# Patient Record
Sex: Female | Born: 1970
Health system: Southern US, Community
[De-identification: ages and names within clinical notes are randomized; demographics above are authoritative.]

## PROBLEM LIST (undated history)

## (undated) DIAGNOSIS — F32A Depression, unspecified: Secondary | ICD-10-CM

## (undated) DIAGNOSIS — Z8489 Family history of other specified conditions: Secondary | ICD-10-CM

## (undated) DIAGNOSIS — D649 Anemia, unspecified: Secondary | ICD-10-CM

## (undated) DIAGNOSIS — F419 Anxiety disorder, unspecified: Secondary | ICD-10-CM

## (undated) HISTORY — PX: LAPAROSCOPIC GELPORT ASSISTED MYOMECTOMY: SHX6549

## (undated) HISTORY — DX: Anxiety disorder, unspecified: F41.9

## (undated) HISTORY — DX: Depression, unspecified: F32.A

## (undated) HISTORY — DX: Anemia, unspecified: D64.9

## (undated) HISTORY — PX: UTERINE FIBROID SURGERY: SHX826

---
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
## 1999-09-10 ENCOUNTER — Emergency Department (HOSPITAL_COMMUNITY): Admission: EM | Admit: 1999-09-10 | Discharge: 1999-09-10 | Payer: Self-pay | Admitting: *Deleted

## 2003-06-12 ENCOUNTER — Other Ambulatory Visit: Admission: RE | Admit: 2003-06-12 | Discharge: 2003-06-12 | Payer: Self-pay | Admitting: Obstetrics and Gynecology

## 2004-06-20 ENCOUNTER — Other Ambulatory Visit: Admission: RE | Admit: 2004-06-20 | Discharge: 2004-06-20 | Payer: Self-pay | Admitting: Obstetrics and Gynecology

## 2004-12-04 ENCOUNTER — Other Ambulatory Visit: Admission: RE | Admit: 2004-12-04 | Discharge: 2004-12-04 | Payer: Self-pay | Admitting: Obstetrics and Gynecology

## 2005-04-16 ENCOUNTER — Other Ambulatory Visit: Admission: RE | Admit: 2005-04-16 | Discharge: 2005-04-16 | Payer: Self-pay | Admitting: Obstetrics and Gynecology

## 2005-04-17 ENCOUNTER — Other Ambulatory Visit: Admission: RE | Admit: 2005-04-17 | Discharge: 2005-04-17 | Payer: Self-pay | Admitting: Obstetrics and Gynecology

## 2005-09-18 ENCOUNTER — Other Ambulatory Visit: Admission: RE | Admit: 2005-09-18 | Discharge: 2005-09-18 | Payer: Self-pay | Admitting: Obstetrics and Gynecology

## 2006-12-21 ENCOUNTER — Ambulatory Visit (HOSPITAL_COMMUNITY): Admission: RE | Admit: 2006-12-21 | Discharge: 2006-12-21 | Payer: Self-pay | Admitting: Obstetrics and Gynecology

## 2007-07-05 IMAGING — RF DG HYSTEROGRAM
4 series · 4 of 4 positions shown · IV contrast (omnipaque)
Comparison: none

CLINICAL DATA: Evaluate tube patentcy due to large right-sided fibroid; infertility.  
HYSTEROSALPINGOGRAM:
TECHNIQUE: Following cleansing of the cervix and vagina with Betadine, a hysterosalpingogram catheter was placed into the endocervical canal.  Hysterosalpingogram was then performed using Omnipaque 300 contrast.

[Series 1: run · 1 of 1 slices shown (1 of 4)]
[im 1/1]
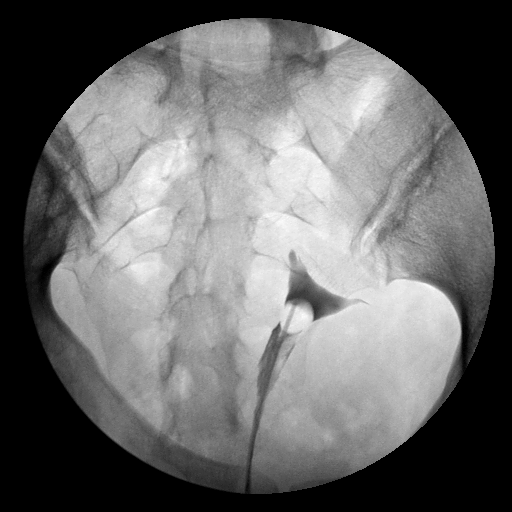

[Series 2: run · 1 of 1 slices shown (2 of 4)]
[im 1/1]
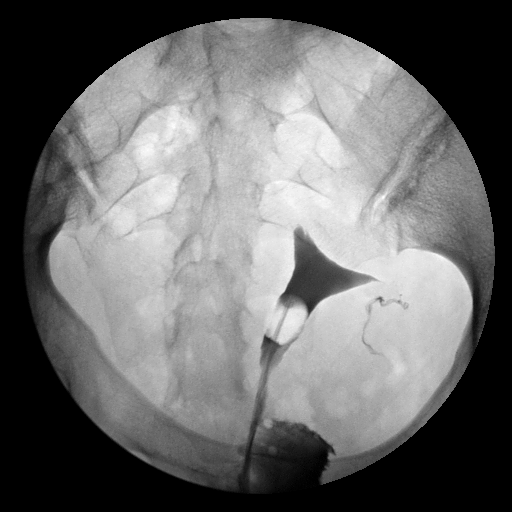

[Series 3: run · 1 of 1 slices shown (3 of 4)]
[im 1/1]
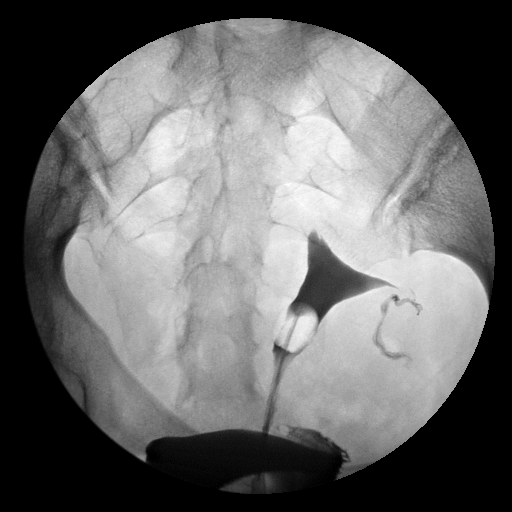

[Series 4: run · 1 of 1 slices shown (4 of 4)]
[im 1/1]
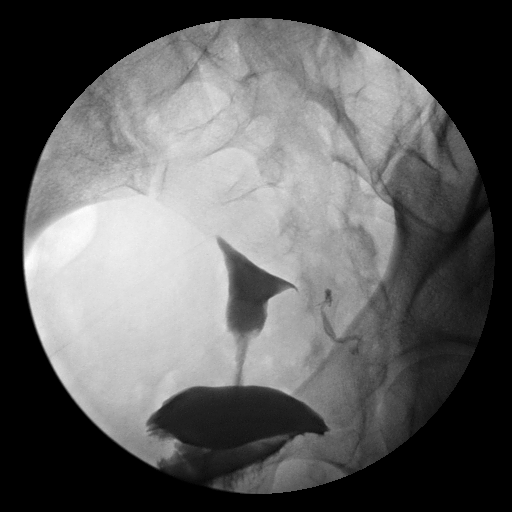

[4 of 4 positions shown; findings below may reference images not displayed]

FINDINGS: The uterus has a normal appearance with no evidence of filling defect or extrinsic mass effect.  The right fallopian tube does not fill and is blocked proximally.  The left fallopian tube fills to its fimbrial portion, but there is no free spill of contrast into the adnexa.
IMPRESSION: 1.  The right tube is blocked proximally.  I do not see extrinsic mass effect from the known fibroid on the right.  
2.  The left tube fills to its fimbrial portion, but is blocked distally as there is no free spill of contrast into the left adnexal region.

## 2007-10-20 HISTORY — PX: UTERINE FIBROID SURGERY: SHX826

## 2009-01-27 ENCOUNTER — Inpatient Hospital Stay (HOSPITAL_COMMUNITY): Admission: AD | Admit: 2009-01-27 | Discharge: 2009-01-30 | Payer: Self-pay | Admitting: Obstetrics and Gynecology

## 2009-02-10 ENCOUNTER — Inpatient Hospital Stay (HOSPITAL_COMMUNITY): Admission: AD | Admit: 2009-02-10 | Discharge: 2009-02-10 | Payer: Self-pay | Admitting: Obstetrics & Gynecology

## 2011-01-28 LAB — GLUCOSE, CAPILLARY: Glucose-Capillary: 76 mg/dL (ref 70–99)

## 2011-01-28 LAB — CBC
HCT: 31.5 % — ABNORMAL LOW (ref 36.0–46.0)
HCT: 37.5 % (ref 36.0–46.0)
Hemoglobin: 11.2 g/dL — ABNORMAL LOW (ref 12.0–15.0)
Hemoglobin: 12.8 g/dL (ref 12.0–15.0)
MCV: 95.5 fL (ref 78.0–100.0)
MCV: 95.8 fL (ref 78.0–100.0)
Platelets: 174 10*3/uL (ref 150–400)
Platelets: 216 10*3/uL (ref 150–400)
RDW: 13.3 % (ref 11.5–15.5)
WBC: 11.2 10*3/uL — ABNORMAL HIGH (ref 4.0–10.5)
WBC: 19.6 10*3/uL — ABNORMAL HIGH (ref 4.0–10.5)

## 2011-01-28 LAB — RPR: RPR Ser Ql: NONREACTIVE

## 2011-08-07 ENCOUNTER — Other Ambulatory Visit (HOSPITAL_COMMUNITY): Payer: Self-pay | Admitting: Obstetrics and Gynecology

## 2011-08-07 DIAGNOSIS — Z1231 Encounter for screening mammogram for malignant neoplasm of breast: Secondary | ICD-10-CM

## 2011-08-27 ENCOUNTER — Ambulatory Visit (HOSPITAL_COMMUNITY)
Admission: RE | Admit: 2011-08-27 | Discharge: 2011-08-27 | Disposition: A | Discharge status: Home / Self Care | Payer: BC Managed Care – PPO | Source: Ambulatory Visit | Attending: Obstetrics and Gynecology | Admitting: Obstetrics and Gynecology

## 2011-08-27 DIAGNOSIS — Z1231 Encounter for screening mammogram for malignant neoplasm of breast: Secondary | ICD-10-CM

## 2012-12-12 ENCOUNTER — Other Ambulatory Visit (HOSPITAL_COMMUNITY): Payer: Self-pay | Admitting: Obstetrics and Gynecology

## 2012-12-12 DIAGNOSIS — Z1231 Encounter for screening mammogram for malignant neoplasm of breast: Secondary | ICD-10-CM

## 2012-12-19 ENCOUNTER — Ambulatory Visit (HOSPITAL_COMMUNITY)
Admission: RE | Admit: 2012-12-19 | Discharge: 2012-12-19 | Disposition: A | Discharge status: Home / Self Care | Payer: BC Managed Care – PPO | Source: Ambulatory Visit | Attending: Obstetrics and Gynecology | Admitting: Obstetrics and Gynecology

## 2012-12-19 DIAGNOSIS — Z1231 Encounter for screening mammogram for malignant neoplasm of breast: Secondary | ICD-10-CM | POA: Insufficient documentation

## 2013-12-27 ENCOUNTER — Other Ambulatory Visit (HOSPITAL_COMMUNITY): Payer: Self-pay | Admitting: Obstetrics and Gynecology

## 2013-12-27 DIAGNOSIS — Z1231 Encounter for screening mammogram for malignant neoplasm of breast: Secondary | ICD-10-CM

## 2014-01-03 ENCOUNTER — Ambulatory Visit (HOSPITAL_COMMUNITY)
Admission: RE | Admit: 2014-01-03 | Discharge: 2014-01-03 | Disposition: A | Discharge status: Home / Self Care | Payer: BC Managed Care – PPO | Source: Ambulatory Visit | Attending: Obstetrics and Gynecology | Admitting: Obstetrics and Gynecology

## 2014-01-03 ENCOUNTER — Other Ambulatory Visit (HOSPITAL_COMMUNITY): Payer: Self-pay | Admitting: Obstetrics and Gynecology

## 2014-01-03 DIAGNOSIS — Z1231 Encounter for screening mammogram for malignant neoplasm of breast: Secondary | ICD-10-CM

## 2014-05-11 ENCOUNTER — Ambulatory Visit (INDEPENDENT_AMBULATORY_CARE_PROVIDER_SITE_OTHER): Payer: BC Managed Care – PPO | Admitting: Family Medicine

## 2014-05-11 VITALS — BP 110/58 | HR 73 | Temp 98.1°F | Resp 16 | Ht 66.0 in | Wt 154.6 lb

## 2014-05-11 DIAGNOSIS — R059 Cough, unspecified: Secondary | ICD-10-CM

## 2014-05-11 DIAGNOSIS — R05 Cough: Secondary | ICD-10-CM

## 2014-05-11 MED ORDER — BENZONATATE 100 MG PO CAPS: 100.0000 mg | ORAL_CAPSULE | Freq: Three times a day (TID) | ORAL | Status: DC | PRN

## 2014-05-11 MED ORDER — AZITHROMYCIN 250 MG PO TABS: ORAL_TABLET | ORAL | Status: DC

## 2014-05-11 MED ORDER — HYDROCODONE-HOMATROPINE 5-1.5 MG/5ML PO SYRP: 5.0000 mL | ORAL_SOLUTION | Freq: Three times a day (TID) | ORAL | Status: DC | PRN

## 2014-05-11 NOTE — Patient Instructions (Addendum)
Use the azithromycin as directed, and the tessalon perles for cough.  Also, use the hycodan as needed for cough at night- do not use when you need to drive.    Let me know if you are not better in the next few days- Sooner if worse.

## 2014-05-11 NOTE — Progress Notes (Signed)
[facility] [address] [place] [address] [phone]  Date:  05/11/2014   Name:  Emily Nguyen   DOB:  1971/05/27   MRN:  [id number]  PCP:  No PCP Per Patient    Chief Complaint: Cough, Headache and Sore Throat   History of Present Illness:  Emily Nguyen is a 43 y.o. very pleasant female patient who presents with the following:  She is hee today with a cough/ cold "that will not go away."  She has been ill for about 10 days.  Notes that she is coughing quite a bit, and also had had some headache in her frontal sinuses.  The cough can be productive.  She is having some fits of coughing and will cough to the point of gagging some of the time. No post- tussive emesis  She did have a mild earache over the weekend, but this resolved.  Some clear nasal drainage but nothing persistent No GI symptoms.  She has not noted a fever, chills or aches.    She is generally in excellent health.  She rarely needs to see an MD- she only has her OBG and does not have another primary provider at this time.  She is married with a 26 year old son.  Never smoker LMP 7/16.    There are no active problems to display for this patient.   History reviewed. No pertinent past medical history.  Past Surgical History  Procedure Laterality Date  . Uterine fibroid surgery      History  Substance Use Topics  . Smoking status: Never Smoker   . Smokeless tobacco: Not on file  . Alcohol Use: Not on file    Family History  Problem Relation Age of Onset  . Cancer Father   . Cancer Maternal Grandmother   . Heart disease Paternal Grandmother   . Hypertension Paternal Grandmother     Allergies  Allergen Reactions  . Penicillins Hives    Medication list has been reviewed and updated.  No current outpatient prescriptions on file prior to visit.   No current facility-administered medications on file prior to visit.    Review of Systems:  As per HPI- otherwise  negative.   Physical Examination: Filed Vitals:   05/11/14 1428  BP: 110/58  Pulse: 73  Temp: 98.1 F (36.7 C)  Resp: 16   Filed Vitals:   05/11/14 1428  Height: 5\' 6"  (1.676 m)  Weight: 154 lb 9.6 oz (70.126 kg)   Body mass index is 24.96 kg/(m^2). Ideal Body Weight: Weight in (lb) to have BMI = 25: 154.6  GEN: WDWN, NAD, Non-toxic, A & O x 3, looks well.   HEENT: Atraumatic, Normocephalic. Neck supple. No masses, No LAD. Bilateral TM wnl, oropharynx normal.  PEERL,EOMI.   Nasal cavity WNL Ears and Nose: No external deformity. CV: RRR, No M/G/R. No JVD. No thrill. No extra heart sounds. PULM: CTA B, no wheezes, crackles, rhonchi. No retractions. No resp. distress. No accessory muscle use. EXTR: No c/c/e NEURO Normal gait.  PSYCH: Normally interactive. Conversant. Not depressed or anxious appearing.  Calm demeanor.    Assessment and Plan: Cough - Plan: azithromycin (ZITHROMAX) 250 MG tablet, benzonatate (TESSALON) 100 MG capsule, HYDROcodone-homatropine (HYCODAN) 5-1.5 MG/5ML syrup  persistent cough for 10 days.  Consider mycoplasma or pertussis.  Izetta declines a pertussis test, but would like to be treated for same.  Azithromycin, hycodan and tessalon as above.  Close follow-up if not  better soon- Sooner if worse.     Signed Lamar Blinks, MD

## 2014-12-04 ENCOUNTER — Ambulatory Visit (INDEPENDENT_AMBULATORY_CARE_PROVIDER_SITE_OTHER): Payer: BLUE CROSS/BLUE SHIELD

## 2014-12-05 ENCOUNTER — Ambulatory Visit (INDEPENDENT_AMBULATORY_CARE_PROVIDER_SITE_OTHER): Payer: BLUE CROSS/BLUE SHIELD | Admitting: Physician Assistant

## 2014-12-05 VITALS — BP 98/64 | HR 61 | Temp 97.9°F | Resp 16 | Ht 65.25 in | Wt 154.0 lb

## 2014-12-05 DIAGNOSIS — R05 Cough: Secondary | ICD-10-CM | POA: Diagnosis not present

## 2014-12-05 DIAGNOSIS — J069 Acute upper respiratory infection, unspecified: Secondary | ICD-10-CM | POA: Diagnosis not present

## 2014-12-05 DIAGNOSIS — R059 Cough, unspecified: Secondary | ICD-10-CM

## 2014-12-05 MED ORDER — BENZONATATE 100 MG PO CAPS: 100.0000 mg | ORAL_CAPSULE | Freq: Three times a day (TID) | ORAL | Status: DC | PRN

## 2014-12-05 MED ORDER — GUAIFENESIN ER 1200 MG PO TB12: 1.0000 | ORAL_TABLET | Freq: Two times a day (BID) | ORAL | Status: DC | PRN

## 2014-12-05 NOTE — Progress Notes (Signed)
  Medical screening examination/treatment/procedure(s) were performed by non-physician practitioner and as supervising physician I was immediately available for consultation/collaboration.     

## 2014-12-05 NOTE — Patient Instructions (Signed)
Upper Respiratory Infection, Adult An upper respiratory infection (URI) is also sometimes known as the common cold. The upper respiratory tract includes the nose, sinuses, throat, trachea, and bronchi. Bronchi are the airways leading to the lungs. Most people improve within 1 week, but symptoms can last up to 2 weeks. A residual cough may last even longer.  CAUSES Many different viruses can infect the tissues lining the upper respiratory tract. The tissues become irritated and inflamed and often become very moist. Mucus production is also common. A cold is contagious. You can easily spread the virus to others by oral contact. This includes kissing, sharing a glass, coughing, or sneezing. Touching your mouth or nose and then touching a surface, which is then touched by another person, can also spread the virus. SYMPTOMS  Symptoms typically develop 1 to 3 days after you come in contact with a cold virus. Symptoms vary from person to person. They may include:  Runny nose.  Sneezing.  Nasal congestion.  Sinus irritation.  Sore throat.  Loss of voice (laryngitis).  Cough.  Fatigue.  Muscle aches.  Loss of appetite.  Headache.  Low-grade fever. DIAGNOSIS  You might diagnose your own cold based on familiar symptoms, since most people get a cold 2 to 3 times a year. Your caregiver can confirm this based on your exam. Most importantly, your caregiver can check that your symptoms are not due to another disease such as strep throat, sinusitis, pneumonia, asthma, or epiglottitis. Blood tests, throat tests, and X-rays are not necessary to diagnose a common cold, but they may sometimes be helpful in excluding other more serious diseases. Your caregiver will decide if any further tests are required. RISKS AND COMPLICATIONS  You may be at risk for a more severe case of the common cold if you smoke cigarettes, have chronic heart disease (such as heart failure) or lung disease (such as asthma), or if  you have a weakened immune system. The very young and very old are also at risk for more serious infections. Bacterial sinusitis, middle ear infections, and bacterial pneumonia can complicate the common cold. The common cold can worsen asthma and chronic obstructive pulmonary disease (COPD). Sometimes, these complications can require emergency medical care and may be life-threatening. PREVENTION  The best way to protect against getting a cold is to practice good hygiene. Avoid oral or hand contact with people with cold symptoms. Wash your hands often if contact occurs. There is no clear evidence that vitamin C, vitamin E, echinacea, or exercise reduces the chance of developing a cold. However, it is always recommended to get plenty of rest and practice good nutrition. TREATMENT  Treatment is directed at relieving symptoms. There is no cure. Antibiotics are not effective, because the infection is caused by a virus, not by bacteria. Treatment may include:  Increased fluid intake. Sports drinks offer valuable electrolytes, sugars, and fluids.  Breathing heated mist or steam (vaporizer or shower).  Eating chicken soup or other clear broths, and maintaining good nutrition.  Getting plenty of rest.  Using gargles or lozenges for comfort.  Controlling fevers with ibuprofen or acetaminophen as directed by your caregiver.  Increasing usage of your inhaler if you have asthma. Zinc gel and zinc lozenges, taken in the first 24 hours of the common cold, can shorten the duration and lessen the severity of symptoms. Pain medicines may help with fever, muscle aches, and throat pain. A variety of non-prescription medicines are available to treat congestion and runny nose. Your caregiver   can make recommendations and may suggest nasal or lung inhalers for other symptoms.  HOME CARE INSTRUCTIONS   Only take over-the-counter or prescription medicines for pain, discomfort, or fever as directed by your  caregiver.  Use a warm mist humidifier or inhale steam from a shower to increase air moisture. This may keep secretions moist and make it easier to breathe.  Drink enough water and fluids to keep your urine clear or pale yellow.  Rest as needed.  Return to work when your temperature has returned to normal or as your caregiver advises. You may need to stay home longer to avoid infecting others. You can also use a face mask and careful hand washing to prevent spread of the virus. SEEK MEDICAL CARE IF:   After the first few days, you feel you are getting worse rather than better.  You need your caregiver's advice about medicines to control symptoms.  You develop chills, worsening shortness of breath, or brown or red sputum. These may be signs of pneumonia.  You develop yellow or brown nasal discharge or pain in the face, especially when you bend forward. These may be signs of sinusitis.  You develop a fever, swollen neck glands, pain with swallowing, or white areas in the back of your throat. These may be signs of strep throat. SEEK IMMEDIATE MEDICAL CARE IF:   You have a fever.  You develop severe or persistent headache, ear pain, sinus pain, or chest pain.  You develop wheezing, a prolonged cough, cough up blood, or have a change in your usual mucus (if you have chronic lung disease).  You develop sore muscles or a stiff neck. Document Released: 03/31/2001 Document Revised: 12/28/2011 Document Reviewed: 01/10/2014 Exit[employer] Patient Information 2015 [employer]. This information is not intended to replace advice given to you by your health care provider. Make sure you discuss any questions you have with your health care provider.  

## 2014-12-05 NOTE — Progress Notes (Signed)
   Subjective:    Patient ID: Emily Nguyen, female    DOB: 1970/11/26, 44 y.o.   MRN: [id number]  HPI Patient presents with 4 days of productive cough. Mucus thick and green. Additionally, endorses nasal congestion and ears popping. Denies sinus pressure, sore throat, HA, fever, or N/V. Denies sick contacts and h/o asthma or allergies. Has school aged child. Had cold 4 weeks ago that had a lingering cough, but all other sx improved. Has tried cough drops and tessalon at night with relief. Didn't want to to take tessalon during the day bc she thought it would make her drowsy. Med allergy: PCN.   Review of Systems  Constitutional: Negative for fever and chills.  HENT: Positive for congestion. Negative for ear discharge, ear pain, postnasal drip, rhinorrhea, sinus pressure, sneezing, sore throat and tinnitus.   Eyes: Negative.   Respiratory: Positive for cough. Negative for wheezing.   Cardiovascular: Negative for chest pain.  Gastrointestinal: Negative for nausea and vomiting.  Neurological: Negative for dizziness and headaches.       Objective:   Physical Exam  Constitutional: She is oriented to person, place, and time. She appears well-developed and well-nourished. No distress.  Blood pressure 98/64, pulse 61, temperature 97.9 F (36.6 C), temperature source Oral, resp. rate 16, height 5' 5.25" (1.657 m), weight 154 lb (69.854 kg), last menstrual period 12/01/2014, SpO2 99 %.  HENT:  Head: Normocephalic and atraumatic.  Right Ear: Tympanic membrane, external ear and ear canal normal.  Left Ear: Tympanic membrane, external ear and ear canal normal.  Nose: Rhinorrhea present. No mucosal edema. Right sinus exhibits no maxillary sinus tenderness and no frontal sinus tenderness. Left sinus exhibits no maxillary sinus tenderness and no frontal sinus tenderness.  Mouth/Throat: Uvula is midline, oropharynx is clear and moist and mucous membranes are normal. No oropharyngeal exudate, posterior  oropharyngeal edema or posterior oropharyngeal erythema.  1+ hypertrophic right tonsil. No erythema or exudate.  Eyes: Conjunctivae are normal. Right eye exhibits no discharge. Left eye exhibits no discharge. No scleral icterus.  Neck: Normal range of motion. Neck supple. No thyromegaly present.  Cardiovascular: Normal rate, regular rhythm and normal heart sounds.  Exam reveals no gallop and no friction rub.   No murmur heard. Pulmonary/Chest: Effort normal and breath sounds normal. No respiratory distress. She has no decreased breath sounds. She has no wheezes. She has no rhonchi. She has no rales.  Abdominal: Soft. There is no tenderness.  Lymphadenopathy:    She has no cervical adenopathy.  Neurological: She is alert and oriented to person, place, and time.  Skin: Skin is warm and dry. No rash noted. She is not diaphoretic. No erythema. No pallor.  Psychiatric: She has a normal mood and affect. Her behavior is normal. Judgment and thought content normal.        Assessment & Plan:  1. Acute upper respiratory infection 2. Cough Take Mucinex with plenty of water.  - benzonatate (TESSALON) 100 MG capsule; Take 1 capsule (100 mg total) by mouth 3 (three) times daily as needed for cough.  Dispense: 40 capsule; Refill: 0 - Guaifenesin (MUCINEX MAXIMUM STRENGTH) 1200 MG TB12; Take 1 tablet (1,200 mg total) by mouth every 12 (twelve) hours as needed.  Dispense: 14 tablet; Refill: Byron PA-C  [facility] 12/05/2014 9:31 AM

## 2016-03-24 DIAGNOSIS — D2262 Melanocytic nevi of left upper limb, including shoulder: Secondary | ICD-10-CM | POA: Diagnosis not present

## 2016-03-24 DIAGNOSIS — D2239 Melanocytic nevi of other parts of face: Secondary | ICD-10-CM | POA: Diagnosis not present

## 2016-03-24 DIAGNOSIS — L720 Epidermal cyst: Secondary | ICD-10-CM | POA: Diagnosis not present

## 2016-03-24 DIAGNOSIS — L738 Other specified follicular disorders: Secondary | ICD-10-CM | POA: Diagnosis not present

## 2016-10-05 DIAGNOSIS — Z01419 Encounter for gynecological examination (general) (routine) without abnormal findings: Secondary | ICD-10-CM | POA: Diagnosis not present

## 2016-10-05 DIAGNOSIS — Z113 Encounter for screening for infections with a predominantly sexual mode of transmission: Secondary | ICD-10-CM | POA: Diagnosis not present

## 2016-10-05 DIAGNOSIS — Z6824 Body mass index (BMI) 24.0-24.9, adult: Secondary | ICD-10-CM | POA: Diagnosis not present

## 2016-10-05 DIAGNOSIS — Z1231 Encounter for screening mammogram for malignant neoplasm of breast: Secondary | ICD-10-CM | POA: Diagnosis not present

## 2016-10-05 DIAGNOSIS — Z1151 Encounter for screening for human papillomavirus (HPV): Secondary | ICD-10-CM | POA: Diagnosis not present

## 2016-10-30 DIAGNOSIS — J111 Influenza due to unidentified influenza virus with other respiratory manifestations: Secondary | ICD-10-CM | POA: Diagnosis not present

## 2017-05-13 DIAGNOSIS — L309 Dermatitis, unspecified: Secondary | ICD-10-CM | POA: Diagnosis not present

## 2017-06-28 DIAGNOSIS — L738 Other specified follicular disorders: Secondary | ICD-10-CM | POA: Diagnosis not present

## 2017-06-28 DIAGNOSIS — D2262 Melanocytic nevi of left upper limb, including shoulder: Secondary | ICD-10-CM | POA: Diagnosis not present

## 2017-06-28 DIAGNOSIS — L700 Acne vulgaris: Secondary | ICD-10-CM | POA: Diagnosis not present

## 2017-06-28 DIAGNOSIS — L309 Dermatitis, unspecified: Secondary | ICD-10-CM | POA: Diagnosis not present

## 2017-07-28 DIAGNOSIS — Z23 Encounter for immunization: Secondary | ICD-10-CM | POA: Diagnosis not present

## 2017-10-06 DIAGNOSIS — Z1151 Encounter for screening for human papillomavirus (HPV): Secondary | ICD-10-CM | POA: Diagnosis not present

## 2017-10-06 DIAGNOSIS — Z6824 Body mass index (BMI) 24.0-24.9, adult: Secondary | ICD-10-CM | POA: Diagnosis not present

## 2017-10-06 DIAGNOSIS — Z1231 Encounter for screening mammogram for malignant neoplasm of breast: Secondary | ICD-10-CM | POA: Diagnosis not present

## 2017-10-06 DIAGNOSIS — Z01419 Encounter for gynecological examination (general) (routine) without abnormal findings: Secondary | ICD-10-CM | POA: Diagnosis not present

## 2017-11-02 DIAGNOSIS — N924 Excessive bleeding in the premenopausal period: Secondary | ICD-10-CM | POA: Diagnosis not present

## 2017-11-11 DIAGNOSIS — N924 Excessive bleeding in the premenopausal period: Secondary | ICD-10-CM | POA: Diagnosis not present

## 2017-11-11 DIAGNOSIS — N840 Polyp of corpus uteri: Secondary | ICD-10-CM | POA: Diagnosis not present

## 2017-12-01 DIAGNOSIS — J00 Acute nasopharyngitis [common cold]: Secondary | ICD-10-CM | POA: Diagnosis not present

## 2017-12-03 ENCOUNTER — Encounter (HOSPITAL_COMMUNITY): Payer: Self-pay | Admitting: *Deleted

## 2017-12-07 ENCOUNTER — Other Ambulatory Visit: Payer: Self-pay | Admitting: Obstetrics and Gynecology

## 2017-12-08 ENCOUNTER — Encounter (HOSPITAL_COMMUNITY): Payer: Self-pay | Admitting: *Deleted

## 2017-12-08 ENCOUNTER — Other Ambulatory Visit: Payer: Self-pay

## 2017-12-16 NOTE — Anesthesia Preprocedure Evaluation (Addendum)
Anesthesia Evaluation  Patient identified by MRN, date of birth, ID band Patient awake    Reviewed: Allergy & Precautions, NPO status , Patient's Chart, lab work & pertinent test results  History of Anesthesia Complications Negative for: history of anesthetic complications  Airway Mallampati: II  TM Distance: >3 FB Neck ROM: Full    Dental no notable dental hx. (+) Dental Advisory Given   Pulmonary neg pulmonary ROS,    Pulmonary exam normal        Cardiovascular negative cardio ROS Normal cardiovascular exam     Neuro/Psych negative neurological ROS  negative psych ROS   GI/Hepatic negative GI ROS, Neg liver ROS,   Endo/Other  negative endocrine ROS  Renal/GU negative Renal ROS  negative genitourinary   Musculoskeletal negative musculoskeletal ROS (+)   Abdominal   Peds negative pediatric ROS (+)  Hematology negative hematology ROS (+)   Anesthesia Other Findings   Reproductive/Obstetrics                            Anesthesia Physical Anesthesia Plan  ASA: II  Anesthesia Plan: General   Post-op Pain Management:    Induction: Intravenous  PONV Risk Score and Plan: 3 and Ondansetron, Dexamethasone and Scopolamine patch - Pre-op  Airway Management Planned: LMA  Additional Equipment:   Intra-op Plan:   Post-operative Plan: Extubation in OR  Informed Consent: I have reviewed the patients History and Physical, chart, labs and discussed the procedure including the risks, benefits and alternatives for the proposed anesthesia with the patient or authorized representative who has indicated his/her understanding and acceptance.   Dental advisory given  Plan Discussed with: CRNA and Anesthesiologist  Anesthesia Plan Comments:        Anesthesia Quick Evaluation

## 2017-12-17 ENCOUNTER — Ambulatory Visit (HOSPITAL_COMMUNITY)
Admission: RE | Admit: 2017-12-17 | Discharge: 2017-12-17 | Disposition: A | Discharge status: Home / Self Care | Payer: BLUE CROSS/BLUE SHIELD | Source: Ambulatory Visit | Attending: Obstetrics and Gynecology | Admitting: Obstetrics and Gynecology

## 2017-12-17 ENCOUNTER — Encounter (HOSPITAL_COMMUNITY): Payer: Self-pay

## 2017-12-17 ENCOUNTER — Ambulatory Visit (HOSPITAL_COMMUNITY): Payer: BLUE CROSS/BLUE SHIELD | Admitting: Anesthesiology

## 2017-12-17 ENCOUNTER — Encounter (HOSPITAL_COMMUNITY)
Admission: RE | Disposition: A | Discharge status: Home / Self Care | Payer: Self-pay | Source: Ambulatory Visit | Attending: Obstetrics and Gynecology

## 2017-12-17 DIAGNOSIS — N924 Excessive bleeding in the premenopausal period: Secondary | ICD-10-CM | POA: Insufficient documentation

## 2017-12-17 DIAGNOSIS — Z880 Allergy status to penicillin: Secondary | ICD-10-CM | POA: Diagnosis not present

## 2017-12-17 DIAGNOSIS — Z8249 Family history of ischemic heart disease and other diseases of the circulatory system: Secondary | ICD-10-CM | POA: Insufficient documentation

## 2017-12-17 DIAGNOSIS — N840 Polyp of corpus uteri: Secondary | ICD-10-CM | POA: Diagnosis not present

## 2017-12-17 DIAGNOSIS — N938 Other specified abnormal uterine and vaginal bleeding: Secondary | ICD-10-CM | POA: Diagnosis not present

## 2017-12-17 HISTORY — PX: DILATATION & CURETTAGE/HYSTEROSCOPY WITH MYOSURE: SHX6511

## 2017-12-17 HISTORY — DX: Family history of other specified conditions: Z84.89

## 2017-12-17 LAB — CBC
HCT: 36.3 % (ref 36.0–46.0)
HEMOGLOBIN: 12.5 g/dL (ref 12.0–15.0)
MCH: 31 pg (ref 26.0–34.0)
MCHC: 34.4 g/dL (ref 30.0–36.0)
MCV: 90.1 fL (ref 78.0–100.0)
Platelets: 232 10*3/uL (ref 150–400)
RBC: 4.03 MIL/uL (ref 3.87–5.11)
RDW: 12.1 % (ref 11.5–15.5)
WBC: 4.1 10*3/uL (ref 4.0–10.5)

## 2017-12-17 SURGERY — DILATATION & CURETTAGE/HYSTEROSCOPY WITH MYOSURE
Anesthesia: General

## 2017-12-17 MED ORDER — LIDOCAINE HCL (CARDIAC) 20 MG/ML IV SOLN
INTRAVENOUS | Status: DC | PRN
  Administered 2017-12-17: 100 mg via INTRAVENOUS

## 2017-12-17 MED ORDER — LACTATED RINGERS IV SOLN
INTRAVENOUS | Status: DC
  Administered 2017-12-17 (×2): via INTRAVENOUS

## 2017-12-17 MED ORDER — SCOPOLAMINE 1 MG/3DAYS TD PT72: 1.0000 | MEDICATED_PATCH | Freq: Once | TRANSDERMAL | Status: DC

## 2017-12-17 MED ORDER — LIDOCAINE HCL (CARDIAC) 20 MG/ML IV SOLN
INTRAVENOUS | Status: AC
  Filled 2017-12-17: qty 5

## 2017-12-17 MED ORDER — GLYCOPYRROLATE 0.2 MG/ML IJ SOLN
INTRAMUSCULAR | Status: DC | PRN
  Administered 2017-12-17: 0.1 mg via INTRAVENOUS

## 2017-12-17 MED ORDER — SCOPOLAMINE 1 MG/3DAYS TD PT72
1.0000 | MEDICATED_PATCH | TRANSDERMAL | Status: DC
  Administered 2017-12-17: 1.5 mg via TRANSDERMAL

## 2017-12-17 MED ORDER — SCOPOLAMINE 1 MG/3DAYS TD PT72
MEDICATED_PATCH | TRANSDERMAL | Status: AC
  Administered 2017-12-17: 1.5 mg via TRANSDERMAL
  Filled 2017-12-17: qty 1

## 2017-12-17 MED ORDER — DEXAMETHASONE SODIUM PHOSPHATE 10 MG/ML IJ SOLN
INTRAMUSCULAR | Status: AC
  Filled 2017-12-17: qty 1

## 2017-12-17 MED ORDER — KETOROLAC TROMETHAMINE 30 MG/ML IJ SOLN
INTRAMUSCULAR | Status: AC
  Filled 2017-12-17: qty 2

## 2017-12-17 MED ORDER — FENTANYL CITRATE (PF) 100 MCG/2ML IJ SOLN
INTRAMUSCULAR | Status: AC
  Filled 2017-12-17: qty 4

## 2017-12-17 MED ORDER — ONDANSETRON HCL 4 MG/2ML IJ SOLN
INTRAMUSCULAR | Status: DC | PRN
  Administered 2017-12-17: 4 mg via INTRAVENOUS

## 2017-12-17 MED ORDER — HYDROMORPHONE HCL 1 MG/ML IJ SOLN: 0.2500 mg | INTRAMUSCULAR | Status: DC | PRN

## 2017-12-17 MED ORDER — GLYCOPYRROLATE 0.2 MG/ML IJ SOLN
INTRAMUSCULAR | Status: AC
  Filled 2017-12-17: qty 1

## 2017-12-17 MED ORDER — KETOROLAC TROMETHAMINE 30 MG/ML IJ SOLN
INTRAMUSCULAR | Status: DC | PRN
  Administered 2017-12-17: 30 mg via INTRAVENOUS
  Administered 2017-12-17: 30 mg via INTRAMUSCULAR

## 2017-12-17 MED ORDER — LIDOCAINE HCL 1 % IJ SOLN
INTRAMUSCULAR | Status: AC
  Filled 2017-12-17: qty 20

## 2017-12-17 MED ORDER — ONDANSETRON HCL 4 MG/2ML IJ SOLN
INTRAMUSCULAR | Status: AC
  Filled 2017-12-17: qty 2

## 2017-12-17 MED ORDER — DEXAMETHASONE SODIUM PHOSPHATE 4 MG/ML IJ SOLN
INTRAMUSCULAR | Status: DC | PRN
  Administered 2017-12-17: 10 mg via INTRAVENOUS

## 2017-12-17 MED ORDER — PROMETHAZINE HCL 25 MG/ML IJ SOLN
6.2500 mg | INTRAMUSCULAR | Status: DC | PRN
Start: 2017-12-17 — End: 2017-12-17

## 2017-12-17 MED ORDER — MIDAZOLAM HCL 2 MG/2ML IJ SOLN
INTRAMUSCULAR | Status: AC
  Filled 2017-12-17: qty 2

## 2017-12-17 MED ORDER — MIDAZOLAM HCL 2 MG/2ML IJ SOLN
INTRAMUSCULAR | Status: DC | PRN
  Administered 2017-12-17: 1 mg via INTRAVENOUS

## 2017-12-17 MED ORDER — FENTANYL CITRATE (PF) 250 MCG/5ML IJ SOLN
INTRAMUSCULAR | Status: DC | PRN
  Administered 2017-12-17 (×2): 50 ug via INTRAVENOUS

## 2017-12-17 MED ORDER — PROPOFOL 10 MG/ML IV BOLUS
INTRAVENOUS | Status: DC | PRN
  Administered 2017-12-17: 160 mg via INTRAVENOUS

## 2017-12-17 MED ORDER — PROPOFOL 10 MG/ML IV BOLUS
INTRAVENOUS | Status: AC
  Filled 2017-12-17: qty 40

## 2017-12-17 SURGICAL SUPPLY — 15 items
CANISTER SUCT 3000ML PPV (MISCELLANEOUS) ×2 IMPLANT
CATH ROBINSON RED A/P 16FR (CATHETERS) ×2 IMPLANT
DEVICE MYOSURE LITE (MISCELLANEOUS) IMPLANT
DEVICE MYOSURE REACH (MISCELLANEOUS) IMPLANT
FILTER ARTHROSCOPY CONVERTOR (FILTER) ×2 IMPLANT
GLOVE BIOGEL PI IND STRL 7.0 (GLOVE) ×2 IMPLANT
GLOVE BIOGEL PI INDICATOR 7.0 (GLOVE) ×2
GLOVE ECLIPSE 6.5 STRL STRAW (GLOVE) ×2 IMPLANT
GOWN STRL REUS W/TWL LRG LVL3 (GOWN DISPOSABLE) ×4 IMPLANT
PACK VAGINAL MINOR WOMEN LF (CUSTOM PROCEDURE TRAY) ×2 IMPLANT
PAD OB MATERNITY 4.3X12.25 (PERSONAL CARE ITEMS) ×2 IMPLANT
SEAL ROD LENS SCOPE MYOSURE (ABLATOR) ×2 IMPLANT
TOWEL OR 17X24 6PK STRL BLUE (TOWEL DISPOSABLE) ×4 IMPLANT
TUBING AQUILEX INFLOW (TUBING) ×2 IMPLANT
TUBING AQUILEX OUTFLOW (TUBING) ×2 IMPLANT

## 2017-12-17 NOTE — Discharge Instructions (Signed)
DISCHARGE INSTRUCTIONS: HYSTEROSCOPY / ENDOMETRIAL ABLATION °The following instructions have been prepared to help you care for yourself upon your return home. ° °May Remove Scop patch on or before ° °May take Ibuprofen after ° °May take stool softner while taking narcotic pain medication to prevent constipation.  Drink plenty of water. ° °Personal hygiene: °• Use sanitary pads for vaginal drainage, not tampons. °• Shower the day after your procedure. °• NO tub baths, pools or Jacuzzis for 2-3 weeks. °• Wipe front to back after using the bathroom. ° °Activity and limitations: °• Do NOT drive or operate any equipment for 24 hours. The effects of anesthesia are still present °and drowsiness may result. °• Do NOT rest in bed all day. °• Walking is encouraged. °• Walk up and down stairs slowly. °• You may resume your normal activity in one to two days or as indicated by your physician. °Sexual activity: NO intercourse for at least 2 weeks after the procedure, or as indicated by your °Doctor. ° °Diet: Eat a light meal as desired this evening. You may resume your usual diet tomorrow. ° °Return to Work: You may resume your work activities in one to two days or as indicated by your °Doctor. ° °What to expect after your surgery: Expect to have vaginal bleeding/discharge for 2-3 days and °spotting for up to 10 days. It is not unusual to have soreness for up to 1-2 weeks. You may have a °slight burning sensation when you urinate for the first day. Mild cramps may continue for a couple of °days. You may have a regular period in 2-6 weeks. ° °Call your doctor for any of the following: °• Excessive vaginal bleeding or clotting, saturating and changing one pad every hour. °• Inability to urinate 6 hours after discharge from hospital. °• Pain not relieved by pain medication. °• Fever of 100.4° F or greater. °• Unusual vaginal discharge or odor. ° °Return to office _________________Call for an appointment  ___________________ °Patient’s signature: ______________________ °Nurse’s signature ________________________ ° °Post Anesthesia Care Unit [phone] °Post Anesthesia Home Care Instructions ° °Activity: °Get plenty of rest for the remainder of the day. A responsible individual must stay with you for 24 hours following the procedure.  °For the next 24 hours, DO NOT: °-Drive a car °-Operate machinery °-Drink alcoholic beverages °-Take any medication unless instructed by your physician °-Make any legal decisions or sign important papers. ° °Meals: °Start with liquid foods such as gelatin or soup. Progress to regular foods as tolerated. Avoid greasy, spicy, heavy foods. If nausea and/or vomiting occur, drink only clear liquids until the nausea and/or vomiting subsides. Call your physician if vomiting continues. ° °Special Instructions/Symptoms: °Your throat may feel dry or sore from the anesthesia or the breathing tube placed in your throat during surgery. If this causes discomfort, gargle with warm salt water. The discomfort should disappear within 24 hours. ° °If you had a scopolamine patch placed behind your ear for the management of post- operative nausea and/or vomiting: ° °1. The medication in the patch is effective for 72 hours, after which it should be removed.  Wrap patch in a tissue and discard in the trash. Wash hands thoroughly with soap and water. °2. You may remove the patch earlier than 72 hours if you experience unpleasant side effects which may include dry mouth, dizziness or visual disturbances. °3. Avoid touching the patch. Wash your hands with soap and water after contact with the patch. °  ° °

## 2017-12-17 NOTE — Brief Op Note (Signed)
12/17/2017  10:55 AM  PATIENT:  Emily Nguyen  47 y.o. female  PRE-OPERATIVE DIAGNOSIS:  Abnormal Perimenopausal Bleeding, Endometrial Polyp  POST-OPERATIVE DIAGNOSIS:  Abnormal Perimenopausal Bleeding  PROCEDURE:  Diagnostic hysteroscopy, dilation and curettage  SURGEON:  Surgeon(s) and Role:    * Servando Salina, MD - Primary  PHYSICIAN ASSISTANT:   ASSISTANTS: none   ANESTHESIA:   general Findings: nl tubal ostia seen, thickened endometrium EBL:  2 mL   BLOOD ADMINISTERED:none  DRAINS: none   LOCAL MEDICATIONS USED:  NONE  SPECIMEN:  Source of Specimen:  emc with endometrial polyp  DISPOSITION OF SPECIMEN:  PATHOLOGY  COUNTS:  YES  TOURNIQUET:  * No tourniquets in log *  DICTATION: .Other Dictation: Dictation Number [id number]  PLAN OF CARE: Discharge to home after PACU  PATIENT DISPOSITION:  PACU - hemodynamically stable.   Delay start of Pharmacological VTE agent (>24hrs) due to surgical blood loss or risk of bleeding: no

## 2017-12-17 NOTE — Anesthesia Procedure Notes (Signed)
Procedure Name: LMA Insertion Date/Time: 12/17/2017 10:22 AM Performed by: Flossie Dibble, CRNA Pre-anesthesia Checklist: Patient identified, Patient being monitored, Emergency Drugs available, Timeout performed and Suction available Patient Re-evaluated:Patient Re-evaluated prior to induction Oxygen Delivery Method: Circle System Utilized Preoxygenation: Pre-oxygenation with 100% oxygen Induction Type: IV induction Ventilation: Mask ventilation without difficulty LMA: LMA inserted LMA Size: 4.0 Number of attempts: 1 Placement Confirmation: positive ETCO2 and breath sounds checked- equal and bilateral Tube secured with: Tape Dental Injury: Teeth and Oropharynx as per pre-operative assessment

## 2017-12-17 NOTE — Anesthesia Postprocedure Evaluation (Signed)
Anesthesia Post Note  Patient: Emily Nguyen  Procedure(s) Performed: DILATATION & CURETTAGE/HYSTEROSCOPY WITH MYOSURE (N/A )     Patient location during evaluation: PACU Anesthesia Type: General Level of consciousness: sedated Pain management: pain level controlled Vital Signs Assessment: post-procedure vital signs reviewed and stable Respiratory status: spontaneous breathing and respiratory function stable Cardiovascular status: stable Postop Assessment: no apparent nausea or vomiting Anesthetic complications: no    Last Vitals:  Vitals:   12/17/17 1200 12/17/17 1230  BP: 98/61 (!) 106/52  Pulse: (!) 49 (!) 53  Resp: 12 14  Temp: 37.1 C 36.8 C  SpO2: 97% 99%    Last Pain:  Vitals:   12/17/17 0821  TempSrc: Oral   Pain Goal: Patients Stated Pain Goal: 3 (12/17/17 1208)               Duane Boston DANIEL

## 2017-12-17 NOTE — Transfer of Care (Signed)
Immediate Anesthesia Transfer of Care Note  Patient: Emily Nguyen  Procedure(s) Performed: DILATATION & CURETTAGE/HYSTEROSCOPY WITH MYOSURE (N/A )  Patient Location: PACU  Anesthesia Type:General  Level of Consciousness: awake, alert  and oriented  Airway & Oxygen Therapy: Patient Spontanous Breathing and Patient connected to nasal cannula oxygen  Post-op Assessment: Report given to RN and Post -op Vital signs reviewed and stable  Post vital signs: Reviewed and stable  Last Vitals:  Vitals:   12/17/17 1059 12/17/17 1100  BP: 108/72 108/72  Pulse: (!) 58 64  Resp: 12   Temp: 37 C   SpO2: 100% 100%    Last Pain:  Vitals:   12/17/17 0821  TempSrc: Oral      Patients Stated Pain Goal: 3 (89/79/15 0413)  Complications: No apparent anesthesia complications

## 2017-12-17 NOTE — H&P (Signed)
[name] is an 47 y.o. female P75 MWF with abnormal perimenopausal bleeding who presents for dx hysteroscopy, D&C, resection after office ebx showed fragments of endometrial polyps  Pertinent Gynecological History: Menses: irreg, excessive Bleeding: dysfunctional uterine bleeding Contraception: none DES exposure: denies Blood transfusions: none Sexually transmitted diseases: no past history Previous GYN Procedures: DNC  Last mammogram: normal Date: 10/06/2017 Last pap: normal Date: 10/06/17 OB History: G2, P1   Menstrual History: Menarche age: n/a Patient's last menstrual period was 12/10/2017.    Past Medical History:  Diagnosis Date  . Family history of adverse reaction to anesthesia    MOTHER HAS LOW BP AND PULSE AFTER ANESTHESIA    Past Surgical History:  Procedure Laterality Date  . UTERINE FIBROID SURGERY      Family History  Problem Relation Age of Onset  . Cancer Father   . Cancer Maternal Grandmother   . Heart disease Paternal Grandmother   . Hypertension Paternal Grandmother     Social History:  reports that  has never smoked. she has never used smokeless tobacco. She reports that she drinks about 1.2 oz of alcohol per week. She reports that she does not use drugs.  Allergies:  Allergies  Allergen Reactions  . Penicillins Hives    Has patient had a PCN reaction causing immediate rash, facial/tongue/throat swelling, SOB or lightheadedness with hypotension: No Has patient had a PCN reaction causing severe rash involving mucus membranes or skin necrosis: Yes Has patient had a PCN reaction that required hospitalization: No Has patient had a PCN reaction occurring within the last 10 years: No If all of the above answers are "NO", then may proceed with Cephalosporin use.     Medications Prior to Admission  Medication Sig Dispense Refill Last Dose  . ibuprofen (ADVIL,MOTRIN) 200 MG tablet Take 600 mg by mouth daily as needed for headache or moderate pain.    Past Month at Unknown time    Review of Systems  All other systems reviewed and are negative.   Height 5\' 6"  (1.676 m), weight 65.8 kg (145 lb), last menstrual period 12/10/2017. Physical Exam  Constitutional: She is oriented to person, place, and time. She appears well-developed and well-nourished. No distress.  HENT:  Head: Atraumatic.  Eyes: EOM are normal.  Neck: Neck supple.  GI: Soft.  Genitourinary: Vagina normal.  Musculoskeletal: She exhibits no edema.  Neurological: She is alert and oriented to person, place, and time.  Skin: Skin is warm and dry.  Psychiatric: She has a normal mood and affect.    No results found for this or any previous visit (from the past 24 hour(s)).  No results found.  Assessment/Plan: Abnormal perimenopausal bleeding Endometrial polyps P) dx hysteroscopy, hysteroscopic resection of endometrial polyps, D&C.risk of surgery reviewed including infection, bleeding, injury to surrounding organ structures, internal scar tissue, fluid overload and its risk./mgmt, uterine perforation( 10/998) and its mgmt. All ? answered  Ariele Vidrio A Aolani Piggott 12/17/2017, 8:22 AM

## 2017-12-18 ENCOUNTER — Encounter (HOSPITAL_COMMUNITY): Payer: Self-pay | Admitting: Obstetrics and Gynecology

## 2017-12-20 NOTE — Op Note (Signed)
NAME:  GLENYCE, RANDLE                      ACCOUNT NO.:  MEDICAL RECORD NO.:  [id number]  LOCATION:                                 FACILITY:  PHYSICIAN:  Servando Salina, M.D.    DATE OF BIRTH:  DATE OF PROCEDURE:  12/17/2017 DATE OF DISCHARGE:                              OPERATIVE REPORT   PREOPERATIVE DIAGNOSIS:  Abnormal perimenopausal bleeding, endometrial polyps.  POSTOPERATIVE DIAGNOSIS:  Abnormal perimenopausal bleeding, endometrial polyps.  PROCEDURE:  Diagnostic hysteroscopy, dilation and curettage.  ANESTHESIA:  General.  SURGEON:  Servando Salina, M.D.  ASSISTANT:  None.  DESCRIPTION OF PROCEDURE:  Under adequate general anesthesia, the patient was placed in a dorsal lithotomy position.  She was sterilely prepped and draped in usual fashion.  The bladder was catheterized for moderate amount of urine.  Examination under anesthesia revealed an axial uterus.  No adnexal masses could be appreciated.  Bivalve speculum was placed in the vagina and single-tooth tenaculum was placed on the anterior lip of the cervix.  The cervix was serially dilated up to #21 [facility] dilator.  A diagnostic hysteroscope was introduced into the uterine cavity.  Diffuse endometrial thickening was noted.  Both tubal ostia could be seen.  The endocervical canal was without lesion.  Using the Lite resectoscope, the entire endometrial cavity was then resected. When the procedure was felt to be adequate, all instruments were then removed from the vagina.  SPECIMEN:  Labeled endometrial curetting with polyp was sent to Pathology.  ESTIMATED BLOOD LOSS:  2 mL.  COMPLICATIONS:  None.  The patient tolerated the procedure well, was transferred to recovery room in stable condition.     Servando Salina, M.D.    [place]/MEDQ  D:  12/17/2017  T:  12/18/2017  Job:  [id number]

## 2018-05-23 DIAGNOSIS — F4322 Adjustment disorder with anxiety: Secondary | ICD-10-CM | POA: Diagnosis not present

## 2018-05-30 DIAGNOSIS — F4322 Adjustment disorder with anxiety: Secondary | ICD-10-CM | POA: Diagnosis not present

## 2018-06-06 DIAGNOSIS — F4322 Adjustment disorder with anxiety: Secondary | ICD-10-CM | POA: Diagnosis not present

## 2018-06-16 DIAGNOSIS — F4322 Adjustment disorder with anxiety: Secondary | ICD-10-CM | POA: Diagnosis not present

## 2018-06-21 DIAGNOSIS — F4322 Adjustment disorder with anxiety: Secondary | ICD-10-CM | POA: Diagnosis not present

## 2018-06-23 DIAGNOSIS — F4322 Adjustment disorder with anxiety: Secondary | ICD-10-CM | POA: Diagnosis not present

## 2018-07-01 DIAGNOSIS — F4322 Adjustment disorder with anxiety: Secondary | ICD-10-CM | POA: Diagnosis not present

## 2018-07-05 DIAGNOSIS — F4322 Adjustment disorder with anxiety: Secondary | ICD-10-CM | POA: Diagnosis not present

## 2018-07-11 DIAGNOSIS — F4322 Adjustment disorder with anxiety: Secondary | ICD-10-CM | POA: Diagnosis not present

## 2018-07-12 DIAGNOSIS — F4322 Adjustment disorder with anxiety: Secondary | ICD-10-CM | POA: Diagnosis not present

## 2018-07-19 DIAGNOSIS — F4322 Adjustment disorder with anxiety: Secondary | ICD-10-CM | POA: Diagnosis not present

## 2018-07-26 DIAGNOSIS — F4322 Adjustment disorder with anxiety: Secondary | ICD-10-CM | POA: Diagnosis not present

## 2018-07-29 DIAGNOSIS — L573 Poikiloderma of Civatte: Secondary | ICD-10-CM | POA: Diagnosis not present

## 2018-07-29 DIAGNOSIS — L738 Other specified follicular disorders: Secondary | ICD-10-CM | POA: Diagnosis not present

## 2018-07-29 DIAGNOSIS — L821 Other seborrheic keratosis: Secondary | ICD-10-CM | POA: Diagnosis not present

## 2018-07-29 DIAGNOSIS — D225 Melanocytic nevi of trunk: Secondary | ICD-10-CM | POA: Diagnosis not present

## 2018-08-02 DIAGNOSIS — F4322 Adjustment disorder with anxiety: Secondary | ICD-10-CM | POA: Diagnosis not present

## 2018-08-09 DIAGNOSIS — F4322 Adjustment disorder with anxiety: Secondary | ICD-10-CM | POA: Diagnosis not present

## 2018-08-23 DIAGNOSIS — F4322 Adjustment disorder with anxiety: Secondary | ICD-10-CM | POA: Diagnosis not present

## 2018-09-06 DIAGNOSIS — F4322 Adjustment disorder with anxiety: Secondary | ICD-10-CM | POA: Diagnosis not present

## 2018-09-20 DIAGNOSIS — F4322 Adjustment disorder with anxiety: Secondary | ICD-10-CM | POA: Diagnosis not present

## 2018-09-27 DIAGNOSIS — F4322 Adjustment disorder with anxiety: Secondary | ICD-10-CM | POA: Diagnosis not present

## 2018-10-04 DIAGNOSIS — F4322 Adjustment disorder with anxiety: Secondary | ICD-10-CM | POA: Diagnosis not present

## 2018-10-13 DIAGNOSIS — Z118 Encounter for screening for other infectious and parasitic diseases: Secondary | ICD-10-CM | POA: Diagnosis not present

## 2018-10-13 DIAGNOSIS — Z01419 Encounter for gynecological examination (general) (routine) without abnormal findings: Secondary | ICD-10-CM | POA: Diagnosis not present

## 2018-10-13 DIAGNOSIS — Z6822 Body mass index (BMI) 22.0-22.9, adult: Secondary | ICD-10-CM | POA: Diagnosis not present

## 2018-10-13 DIAGNOSIS — Z113 Encounter for screening for infections with a predominantly sexual mode of transmission: Secondary | ICD-10-CM | POA: Diagnosis not present

## 2018-10-13 DIAGNOSIS — Z124 Encounter for screening for malignant neoplasm of cervix: Secondary | ICD-10-CM | POA: Diagnosis not present

## 2018-10-13 DIAGNOSIS — Z1151 Encounter for screening for human papillomavirus (HPV): Secondary | ICD-10-CM | POA: Diagnosis not present

## 2018-10-13 DIAGNOSIS — Z1231 Encounter for screening mammogram for malignant neoplasm of breast: Secondary | ICD-10-CM | POA: Diagnosis not present

## 2018-10-14 DIAGNOSIS — Z13220 Encounter for screening for lipoid disorders: Secondary | ICD-10-CM | POA: Diagnosis not present

## 2018-10-14 DIAGNOSIS — Z114 Encounter for screening for human immunodeficiency virus [HIV]: Secondary | ICD-10-CM | POA: Diagnosis not present

## 2018-10-14 DIAGNOSIS — Z131 Encounter for screening for diabetes mellitus: Secondary | ICD-10-CM | POA: Diagnosis not present

## 2018-10-14 DIAGNOSIS — Z130 Encounter for screening for diseases of the blood and blood-forming organs and certain disorders involving the immune mechanism: Secondary | ICD-10-CM | POA: Diagnosis not present

## 2018-10-14 DIAGNOSIS — Z1329 Encounter for screening for other suspected endocrine disorder: Secondary | ICD-10-CM | POA: Diagnosis not present

## 2018-10-14 DIAGNOSIS — Z1159 Encounter for screening for other viral diseases: Secondary | ICD-10-CM | POA: Diagnosis not present

## 2018-10-18 DIAGNOSIS — F4322 Adjustment disorder with anxiety: Secondary | ICD-10-CM | POA: Diagnosis not present

## 2018-11-01 DIAGNOSIS — F4322 Adjustment disorder with anxiety: Secondary | ICD-10-CM | POA: Diagnosis not present

## 2018-11-15 DIAGNOSIS — F4322 Adjustment disorder with anxiety: Secondary | ICD-10-CM | POA: Diagnosis not present

## 2018-12-06 DIAGNOSIS — F4322 Adjustment disorder with anxiety: Secondary | ICD-10-CM | POA: Diagnosis not present

## 2018-12-13 DIAGNOSIS — F4322 Adjustment disorder with anxiety: Secondary | ICD-10-CM | POA: Diagnosis not present

## 2018-12-27 DIAGNOSIS — F4322 Adjustment disorder with anxiety: Secondary | ICD-10-CM | POA: Diagnosis not present

## 2019-01-17 DIAGNOSIS — F4322 Adjustment disorder with anxiety: Secondary | ICD-10-CM | POA: Diagnosis not present

## 2019-02-01 DIAGNOSIS — F4322 Adjustment disorder with anxiety: Secondary | ICD-10-CM | POA: Diagnosis not present

## 2019-02-15 DIAGNOSIS — F4322 Adjustment disorder with anxiety: Secondary | ICD-10-CM | POA: Diagnosis not present

## 2019-03-08 DIAGNOSIS — F4322 Adjustment disorder with anxiety: Secondary | ICD-10-CM | POA: Diagnosis not present

## 2019-03-28 DIAGNOSIS — F4322 Adjustment disorder with anxiety: Secondary | ICD-10-CM | POA: Diagnosis not present

## 2019-04-26 DIAGNOSIS — F4322 Adjustment disorder with anxiety: Secondary | ICD-10-CM | POA: Diagnosis not present

## 2019-05-17 DIAGNOSIS — F4322 Adjustment disorder with anxiety: Secondary | ICD-10-CM | POA: Diagnosis not present

## 2019-06-15 DIAGNOSIS — F4322 Adjustment disorder with anxiety: Secondary | ICD-10-CM | POA: Diagnosis not present

## 2019-07-12 DIAGNOSIS — F4322 Adjustment disorder with anxiety: Secondary | ICD-10-CM | POA: Diagnosis not present

## 2019-08-16 DIAGNOSIS — F4322 Adjustment disorder with anxiety: Secondary | ICD-10-CM | POA: Diagnosis not present

## 2021-09-30 ENCOUNTER — Encounter: Payer: Self-pay | Admitting: Gastroenterology

## 2021-10-27 ENCOUNTER — Telehealth: Payer: Self-pay | Admitting: *Deleted

## 2021-10-27 NOTE — Telephone Encounter (Signed)
John please review chart as far as anesthesia and let me know if ok to do in [place].

## 2021-11-06 ENCOUNTER — Ambulatory Visit (AMBULATORY_SURGERY_CENTER): Payer: PRIVATE HEALTH INSURANCE

## 2021-11-06 ENCOUNTER — Other Ambulatory Visit: Payer: Self-pay

## 2021-11-06 VITALS — Ht 66.0 in | Wt 144.0 lb

## 2021-11-06 DIAGNOSIS — Z1211 Encounter for screening for malignant neoplasm of colon: Secondary | ICD-10-CM

## 2021-11-06 MED ORDER — NA SULFATE-K SULFATE-MG SULF 17.5-3.13-1.6 GM/177ML PO SOLN: 1.0000 | Freq: Once | ORAL | 0 refills | Status: AC

## 2021-11-06 NOTE — Progress Notes (Signed)
No egg or soy allergy known to patient  No issues known to pt with past sedation with any surgeries or procedures Patient denies ever being told they had issues or difficulty with intubation  No FH of Malignant Hyperthermia Pt is not on diet pills Pt is not on home 02  Pt is not on blood thinners  Pt denies issues with constipation; No A fib or A flutter Pt is fully vaccinated for Covid x 2; NO PA's for preps discussed with pt in PV today  Discussed with pt there will be an out-of-pocket cost for prep and that varies from $0 to 70 + dollars - pt verbalized understanding  Due to the COVID-19 pandemic we are asking patients to follow certain guidelines in PV and the [place]   Pt aware of COVID protocols and [facility] guidelines  PV completed over the phone. Pt verified name, DOB, address and insurance during PV today.  Pt mailed instruction packet with copy of consent form to read and not return, and instructions.  Pt encouraged to call with questions or issues.  If pt has My chart, procedure instructions sent via My Chart

## 2021-11-13 ENCOUNTER — Encounter: Payer: Self-pay | Admitting: Gastroenterology

## 2021-11-20 ENCOUNTER — Ambulatory Visit (AMBULATORY_SURGERY_CENTER): Payer: 59 | Admitting: Gastroenterology

## 2021-11-20 ENCOUNTER — Other Ambulatory Visit: Payer: Self-pay

## 2021-11-20 ENCOUNTER — Encounter: Payer: Self-pay | Admitting: Gastroenterology

## 2021-11-20 VITALS — BP 100/60 | HR 54 | Temp 96.8°F | Resp 9 | Ht 66.0 in | Wt 144.0 lb

## 2021-11-20 DIAGNOSIS — Z1211 Encounter for screening for malignant neoplasm of colon: Secondary | ICD-10-CM

## 2021-11-20 DIAGNOSIS — D123 Benign neoplasm of transverse colon: Secondary | ICD-10-CM

## 2021-11-20 DIAGNOSIS — K635 Polyp of colon: Secondary | ICD-10-CM | POA: Diagnosis not present

## 2021-11-20 DIAGNOSIS — D122 Benign neoplasm of ascending colon: Secondary | ICD-10-CM

## 2021-11-20 MED ORDER — SODIUM CHLORIDE 0.9 % IV SOLN: 500.0000 mL | Freq: Once | INTRAVENOUS | Status: DC

## 2021-11-20 NOTE — Progress Notes (Signed)
Report given to PACU, vss 

## 2021-11-20 NOTE — Progress Notes (Signed)
Called to room to assist during endoscopic procedure.  Patient ID and intended procedure confirmed with present staff. Received instructions for my participation in the procedure from the performing physician.  

## 2021-11-20 NOTE — Progress Notes (Signed)
0812 HR < 40 with Robinul 0.2 mg given IV. MD updated, vss

## 2021-11-20 NOTE — Progress Notes (Signed)
History and Physical:  This patient presents for endoscopic testing for: Encounter Diagnosis  Name Primary?   Colon cancer screening Yes    Patient denies chronic abdominal pain, rectal bleeding, constipation or diarrhea. First screening exam  ROS: Patient denies chest pain or cough   Past Medical History: Past Medical History:  Diagnosis Date   Anemia    hx of   Anxiety    situational anxiety   Depression    situational depression   Family history of adverse reaction to anesthesia    MOTHER HAS LOW BP AND PULSE AFTER ANESTHESIA     Past Surgical History: Past Surgical History:  Procedure Laterality Date   DILATATION & CURETTAGE/HYSTEROSCOPY WITH MYOSURE N/A 12/17/2017   Procedure: DILATATION & CURETTAGE/HYSTEROSCOPY WITH MYOSURE;  Surgeon: Servando Salina, MD;  Location: [facility];  Service: Gynecology;  Laterality: N/A;   UTERINE FIBROID SURGERY  2009   WISDOM TOOTH EXTRACTION  1990    Allergies: Allergies  Allergen Reactions   Penicillins Hives and Rash    Has patient had a PCN reaction causing immediate rash, facial/tongue/throat swelling, SOB or lightheadedness with hypotension: No Has patient had a PCN reaction causing severe rash involving mucus membranes or skin necrosis: Yes Has patient had a PCN reaction that required hospitalization: No Has patient had a PCN reaction occurring within the last 10 years: No If all of the above answers are "NO", then may proceed with Cephalosporin use.  Has patient had a PCN reaction causing immediate rash, facial/tongue/throat swelling, SOB or lightheadedness with hypotension: No Has patient had a PCN reaction causing severe rash involving mucus membranes or skin necrosis: Yes Has patient had a PCN reaction that required hospitalization: No Has patient had a PCN reaction occurring within the last 10 years: No If all of the above answers are "NO", then may proceed with Cephalosporin use.    Outpatient Meds: Current  Outpatient Medications  Medication Sig Dispense Refill   ibuprofen (ADVIL,MOTRIN) 200 MG tablet Take 600 mg by mouth daily as needed for headache or moderate pain.     Current Facility-Administered Medications  Medication Dose Route Frequency Provider Last Rate Last Admin   0.9 %  sodium chloride infusion  500 mL Intravenous Once Danis, Estill Cotta III, MD          ___________________________________________________________________ Objective   Exam:  BP 97/60    Pulse (!) 55    Temp (!) 96.8 F (36 C)    Resp 12    Ht 5\' 6"  (1.676 m)    Wt 144 lb (65.3 kg)    LMP 12/10/2017    SpO2 100%    BMI 23.24 kg/m   CV: RRR without murmur, S1/S2 Resp: clear to auscultation bilaterally, normal RR and effort noted GI: soft, no tenderness, with active bowel sounds.   Assessment: Encounter Diagnosis  Name Primary?   Colon cancer screening Yes     Plan: Colonoscopy  The benefits and risks of the planned procedure were described in detail with the patient or (when appropriate) their health care proxy.  Risks were outlined as including, but not limited to, bleeding, infection, perforation, adverse medication reaction leading to cardiac or pulmonary decompensation, pancreatitis (if ERCP).  The limitation of incomplete mucosal visualization was also discussed.  No guarantees or warranties were given.    The patient is appropriate for an endoscopic procedure in the ambulatory setting.   - Wilfrid Lund, MD

## 2021-11-20 NOTE — Progress Notes (Signed)
[facility]  Pt's states no medical or surgical changes since previsit or office visit.  

## 2021-11-20 NOTE — Patient Instructions (Signed)
Handout given for polyps.  YOU HAD AN ENDOSCOPIC PROCEDURE TODAY AT THE [facility]:   Refer to the procedure report that was given to you for any specific questions about what was found during the examination.  If the procedure report does not answer your questions, please call your gastroenterologist to clarify.  If you requested that your care partner not be given the details of your procedure findings, then the procedure report has been included in a sealed envelope for you to review at your convenience later.  YOU SHOULD EXPECT: Some feelings of bloating in the abdomen. Passage of more gas than usual.  Walking can help get rid of the air that was put into your GI tract during the procedure and reduce the bloating. If you had a lower endoscopy (such as a colonoscopy or flexible sigmoidoscopy) you may notice spotting of blood in your stool or on the toilet paper. If you underwent a bowel prep for your procedure, you may not have a normal bowel movement for a few days.  Please Note:  You might notice some irritation and congestion in your nose or some drainage.  This is from the oxygen used during your procedure.  There is no need for concern and it should clear up in a day or so.  SYMPTOMS TO REPORT IMMEDIATELY:   Following lower endoscopy (colonoscopy or flexible sigmoidoscopy):  Excessive amounts of blood in the stool  Significant tenderness or worsening of abdominal pains  Swelling of the abdomen that is new, acute  Fever of 100F or higher  For urgent or emergent issues, a gastroenterologist can be reached at any hour by calling [phone]. Do not use MyChart messaging for urgent concerns.    DIET:  We do recommend a small meal at first, but then you may proceed to your regular diet.  Drink plenty of fluids but you should avoid alcoholic beverages for 24 hours.  ACTIVITY:  You should plan to take it easy for the rest of today and you should NOT DRIVE or use heavy  machinery until tomorrow (because of the sedation medicines used during the test).    FOLLOW UP: Our staff will call the number listed on your records 48-72 hours following your procedure to check on you and address any questions or concerns that you may have regarding the information given to you following your procedure. If we do not reach you, we will leave a message.  We will attempt to reach you two times.  During this call, we will ask if you have developed any symptoms of COVID 19. If you develop any symptoms (ie: fever, flu-like symptoms, shortness of breath, cough etc.) before then, please call [phone].  If you test positive for Covid 19 in the 2 weeks post procedure, please call and report this information to us.    If any biopsies were taken you will be contacted by phone or by letter within the next 1-3 weeks.  Please call us at [phone] if you have not heard about the biopsies in 3 weeks.    SIGNATURES/CONFIDENTIALITY: You and/or your care partner have signed paperwork which will be entered into your electronic medical record.  These signatures attest to the fact that that the information above on your After Visit Summary has been reviewed and is understood.  Full responsibility of the confidentiality of this discharge information lies with you and/or your care-partner. 

## 2021-11-20 NOTE — Op Note (Signed)
[facility] Patient Name: Emily Nguyen Procedure Date: 11/20/2021 7:48 AM MRN: [id number] Endoscopist: [place]. Loletha Carrow , MD Age: 52 Referring MD:  Date of Birth: 1971-01-12 Gender: Female Account #: [id number] Procedure:                Colonoscopy Indications:              Screening for colorectal malignant neoplasm, This                            is the patient's first colonoscopy Medicines:                Monitored Anesthesia Care Procedure:                Pre-Anesthesia Assessment:                           - Prior to the procedure, a History and Physical                            was performed, and patient medications and                            allergies were reviewed. The patient's tolerance of                            previous anesthesia was also reviewed. The risks                            and benefits of the procedure and the sedation                            options and risks were discussed with the patient.                            All questions were answered, and informed consent                            was obtained. Prior Anticoagulants: The patient has                            taken no previous anticoagulant or antiplatelet                            agents. ASA Grade Assessment: II - A patient with                            mild systemic disease. After reviewing the risks                            and benefits, the patient was deemed in                            satisfactory condition to undergo the procedure.  After obtaining informed consent, the colonoscope                            was passed under direct vision. Throughout the                            procedure, the patient's blood pressure, pulse, and                            oxygen saturations were monitored continuously. The                            CF [id number] #[id number] was introduced through the anus                            and advanced to the the  cecum, identified by                            appendiceal orifice and ileocecal valve. The                            colonoscopy was extremely difficult due to a                            redundant colon and significant looping. Successful                            completion of the procedure was aided by changing                            the patient's position, using manual pressure and                            straightening and shortening the scope to obtain                            bowel loop reduction. The patient tolerated the                            procedure well. The quality of the bowel                            preparation was excellent. The ileocecal valve,                            appendiceal orifice, and rectum were photographed. Scope In: 8:08:39 AM Scope Out: 8:36:59 AM Scope Withdrawal Time: 0 hours 16 minutes 0 seconds  Total Procedure Duration: 0 hours 28 minutes 20 seconds  Findings:                 The perianal and digital rectal examinations were                            normal.  Repeat examination of right colon under NBI                            performed.                           The colon (entire examined portion) was                            significantly redundant.                           Two semi-sessile polyps with mucus cap were found                            in the proximal transverse colon and proximal                            ascending colon (suspected SSP by WL and NBI                            appearance). The polyps were 4 to 8 mm in size.                            These polyps were removed with a cold snare.                            Resection was complete, but the polyp tissue was                            only partially retrieved.                           The exam was otherwise without abnormality on                            direct and retroflexion views. Complications:            No  immediate complications. Estimated Blood Loss:     Estimated blood loss was minimal. Impression:               - Redundant colon.                           - Two 4 to 8 mm polyps in the proximal transverse                            colon and in the proximal ascending colon, removed                            with a cold snare. Complete resection. Partial                            retrieval.                           -  The examination was otherwise normal on direct                            and retroflexion views. Recommendation:           - Patient has a contact number available for                            emergencies. The signs and symptoms of potential                            delayed complications were discussed with the                            patient. Return to normal activities tomorrow.                            Written discharge instructions were provided to the                            patient.                           - Resume previous diet.                           - Continue present medications.                           - Await pathology results.                           - Repeat colonoscopy is recommended for                            surveillance. The colonoscopy date will be                            determined after pathology results from today's                            exam become available for review. [name], MD 11/20/2021 8:42:59 AM This report has been signed electronically.

## 2021-11-24 ENCOUNTER — Telehealth: Payer: Self-pay

## 2021-11-24 NOTE — Telephone Encounter (Signed)
°  Follow up Call-  Call back number 11/20/2021  Post procedure Call Back phone  # [phone]  Permission to leave phone message Yes  Some recent data might be hidden     Patient questions:  Do you have a fever, pain , or abdominal swelling? No. Pain Score  0 *  Have you tolerated food without any problems? Yes.    Have you been able to return to your normal activities? Yes.    Do you have any questions about your discharge instructions: Diet   No. Medications  No. Follow up visit  No.  Do you have questions or concerns about your Care? No.  Actions: * If pain score is 4 or above: No action needed, pain <4.  Have you developed a fever since your procedure? no  2.   Have you had an respiratory symptoms (SOB or cough) since your procedure? no  3.   Have you tested positive for COVID 19 since your procedure no  4.   Have you had any family members/close contacts diagnosed with the COVID 19 since your procedure?  no   If yes to any of these questions please route to Joylene John, RN and Joella Prince, RN

## 2021-11-25 ENCOUNTER — Encounter: Payer: Self-pay | Admitting: Gastroenterology

## 2022-12-18 ENCOUNTER — Ambulatory Visit (HOSPITAL_COMMUNITY)
Admission: EM | Admit: 2022-12-18 | Discharge: 2022-12-18 | Disposition: A | Discharge status: Home / Self Care | Payer: 59 | Attending: Family Medicine | Admitting: Family Medicine

## 2022-12-18 ENCOUNTER — Ambulatory Visit (INDEPENDENT_AMBULATORY_CARE_PROVIDER_SITE_OTHER): Payer: 59

## 2022-12-18 ENCOUNTER — Encounter (HOSPITAL_COMMUNITY): Payer: Self-pay | Admitting: *Deleted

## 2022-12-18 DIAGNOSIS — S6992XA Unspecified injury of left wrist, hand and finger(s), initial encounter: Secondary | ICD-10-CM

## 2022-12-18 DIAGNOSIS — M79645 Pain in left finger(s): Secondary | ICD-10-CM

## 2022-12-18 NOTE — Discharge Instructions (Signed)
Your xray was negative for any fracture in your finger or hand.  I recommend a wrap for comfort, and you may use motrin for pain/swelling.  If you continue with pain then please follow up with your primary care provider.

## 2022-12-18 NOTE — ED Provider Notes (Signed)
[facility]    CSN: [id number] Arrival date & time: 12/18/22  0820      History   Chief Complaint Chief Complaint  Patient presents with   Finger Injury    HPI Emily Nguyen is a 52 y.o. female.   Yesterday her left 5th finger hit a rail and bent laterally.  She had immediate pain, almost made her vomit.  Still with pain.  Swelling today, bruising noted more at the hand rather than at the finger;  limited mobility.        Past Medical History:  Diagnosis Date   Anemia    hx of   Anxiety    situational anxiety   Depression    situational depression   Family history of adverse reaction to anesthesia    MOTHER HAS LOW BP AND PULSE AFTER ANESTHESIA    There are no problems to display for this patient.   Past Surgical History:  Procedure Laterality Date   DILATATION & CURETTAGE/HYSTEROSCOPY WITH MYOSURE N/A 12/17/2017   Procedure: DILATATION & CURETTAGE/HYSTEROSCOPY WITH MYOSURE;  Surgeon: Servando Salina, MD;  Location: [facility];  Service: Gynecology;  Laterality: N/A;   UTERINE FIBROID SURGERY  2009   WISDOM TOOTH EXTRACTION  1990    OB History   No obstetric history on file.      Home Medications    Prior to Admission medications   Medication Sig Start Date End Date Taking? Authorizing Provider  ibuprofen (ADVIL,MOTRIN) 200 MG tablet Take 600 mg by mouth daily as needed for headache or moderate pain.    [provider]    Family History Family History  Problem Relation Age of Onset   Colon polyps Mother 69   Bladder Cancer Father 44   Esophageal cancer Maternal Grandmother 47   Cancer Maternal Grandmother    Heart disease Paternal Grandmother    Hypertension Paternal Grandmother    Colon cancer Neg Hx    Rectal cancer Neg Hx    Stomach cancer Neg Hx     Social History Social History   Tobacco Use   Smoking status: Never   Smokeless tobacco: Never  Vaping Use   Vaping Use: Never used  Substance Use Topics    Alcohol use: Yes    Alcohol/week: 5.0 standard drinks of alcohol    Types: 5 Standard drinks or equivalent per week   Drug use: No     Allergies   Penicillins   Review of Systems Review of Systems  Constitutional: Negative.   HENT: Negative.    Respiratory: Negative.    Cardiovascular: Negative.   Gastrointestinal: Negative.      Physical Exam Triage Vital Signs ED Triage Vitals  Enc Vitals Group     BP 12/18/22 0911 111/74     Pulse Rate 12/18/22 0911 74     Resp 12/18/22 0911 18     Temp 12/18/22 0911 97.9 F (36.6 C)     Temp Source 12/18/22 0911 Oral     SpO2 12/18/22 0911 98 %     Weight --      Height --      Head Circumference --      Peak Flow --      Pain Score 12/18/22 0910 8     Pain Loc --      Pain Edu? --      Excl. in Industry? --    No data found.  Updated Vital Signs BP 111/74 (BP Location: Left Arm)  Pulse 74   Temp 97.9 F (36.6 C) (Oral)   Resp 18   LMP 12/10/2017   SpO2 98%   Visual Acuity Right Eye Distance:   Left Eye Distance:   Bilateral Distance:    Right Eye Near:   Left Eye Near:    Bilateral Near:     Physical Exam Cardiovascular:     Rate and Rhythm: Normal rate.  Pulmonary:     Effort: Pulmonary effort is normal.  Musculoskeletal:     Comments: Slight swelling to the left hand at the 5th metacarpal and proximal finger;  slight bruising at the palmar aspect of the hand in this area;  TTP to the 5th metacarpal;  no TTP to the 5th finger;  pain with flexion of the fingers at the hand  Neurological:     General: No focal deficit present.     Mental Status: She is alert.  Psychiatric:        Mood and Affect: Mood normal.      UC Treatments / Results  Labs (all labs ordered are listed, but only abnormal results are displayed) Labs Reviewed - No data to display  EKG   Radiology DG Finger Little Left  Result Date: 12/18/2022 CLINICAL DATA:  Injury. EXAM: LEFT LITTLE FINGER 2+V COMPARISON:  None Available.  FINDINGS: Three views. There is no evidence of fracture or dislocation. There is no evidence of arthropathy or other focal bone abnormality. Soft tissues are unremarkable. IMPRESSION: Negative. Electronically Signed   By: Emmit Alexanders M.D.   On: 12/18/2022 09:32    Procedures Procedures (including critical care time)  Medications Ordered in UC Medications - No data to display  Initial Impression / Assessment and Plan / UC Course  I have reviewed the triage vital signs and the nursing notes.  Pertinent labs & imaging results that were available during my care of the patient were reviewed by me and considered in my medical decision making (see chart for details).    Final Clinical Impressions(s) / UC Diagnoses   Final diagnoses:  Injury of left hand, initial encounter     Discharge Instructions      Your xray was negative for any fracture in your finger or hand.  I recommend a wrap for comfort, and you may use motrin for pain/swelling.  If you continue with pain then please follow up with your primary care provider.     ED Prescriptions   None    PDMP not reviewed this encounter.   Rondel Oh, MD 12/18/22 1013

## 2022-12-18 NOTE — ED Triage Notes (Signed)
Pt states yesterday she hit her left pinky on a rail and it bent backwards, there is some discoloration and swelling today. She did take some advil.

## 2023-06-03 ENCOUNTER — Encounter (INDEPENDENT_AMBULATORY_CARE_PROVIDER_SITE_OTHER): Payer: Self-pay

## 2023-06-24 NOTE — Patient Instructions (Addendum)
Tetanus given   Blood work was ordered.   The lab is on the first floor.    Medications changes include :   start a multivitamin    Return in about 1 year (around 06/24/2024) for Physical Exam.   Health Maintenance, Female Adopting a healthy lifestyle and getting preventive care are important in promoting health and wellness. Ask your health care provider about: The right schedule for you to have regular tests and exams. Things you can do on your own to prevent diseases and keep yourself healthy. What should I know about diet, weight, and exercise? Eat a healthy diet  Eat a diet that includes plenty of vegetables, fruits, low-fat dairy products, and lean protein. Do not eat a lot of foods that are high in solid fats, added sugars, or sodium. Maintain a healthy weight Body mass index (BMI) is used to identify weight problems. It estimates body fat based on height and weight. Your health care provider can help determine your BMI and help you achieve or maintain a healthy weight. Get regular exercise Get regular exercise. This is one of the most important things you can do for your health. Most adults should: Exercise for at least 150 minutes each week. The exercise should increase your heart rate and make you sweat (moderate-intensity exercise). Do strengthening exercises at least twice a week. This is in addition to the moderate-intensity exercise. Spend less time sitting. Even light physical activity can be beneficial. Watch cholesterol and blood lipids Have your blood tested for lipids and cholesterol at 52 years of age, then have this test every 5 years. Have your cholesterol levels checked more often if: Your lipid or cholesterol levels are high. You are older than 52 years of age. You are at high risk for heart disease. What should I know about cancer screening? Depending on your health history and family history, you may need to have cancer screening at various ages. This  may include screening for: Breast cancer. Cervical cancer. Colorectal cancer. Skin cancer. Lung cancer. What should I know about heart disease, diabetes, and high blood pressure? Blood pressure and heart disease High blood pressure causes heart disease and increases the risk of stroke. This is more likely to develop in people who have high blood pressure readings or are overweight. Have your blood pressure checked: Every 3-5 years if you are 41-57 years of age. Every year if you are 20 years old or older. Diabetes Have regular diabetes screenings. This checks your fasting blood sugar level. Have the screening done: Once every three years after age 6 if you are at a normal weight and have a low risk for diabetes. More often and at a younger age if you are overweight or have a high risk for diabetes. What should I know about preventing infection? Hepatitis B If you have a higher risk for hepatitis B, you should be screened for this virus. Talk with your health care provider to find out if you are at risk for hepatitis B infection. Hepatitis C Testing is recommended for: Everyone born from [birth year] through 1965. Anyone with known risk factors for hepatitis C. Sexually transmitted infections (STIs) Get screened for STIs, including gonorrhea and chlamydia, if: You are sexually active and are younger than 52 years of age. You are older than 52 years of age and your health care provider tells you that you are at risk for this type of infection. Your sexual activity has changed since you were last screened, and you  are at increased risk for chlamydia or gonorrhea. Ask your health care provider if you are at risk. Ask your health care provider about whether you are at high risk for HIV. Your health care provider may recommend a prescription medicine to help prevent HIV infection. If you choose to take medicine to prevent HIV, you should first get tested for HIV. You should then be tested every 3  months for as long as you are taking the medicine. Pregnancy If you are about to stop having your period (premenopausal) and you may become pregnant, seek counseling before you get pregnant. Take 400 to 800 micrograms (mcg) of folic acid every day if you become pregnant. Ask for birth control (contraception) if you want to prevent pregnancy. Osteoporosis and menopause Osteoporosis is a disease in which the bones lose minerals and strength with aging. This can result in bone fractures. If you are 2 years old or older, or if you are at risk for osteoporosis and fractures, ask your health care provider if you should: Be screened for bone loss. Take a calcium or vitamin D supplement to lower your risk of fractures. Be given hormone replacement therapy (HRT) to treat symptoms of menopause. Follow these instructions at home: Alcohol use Do not drink alcohol if: Your health care provider tells you not to drink. You are pregnant, may be pregnant, or are planning to become pregnant. If you drink alcohol: Limit how much you have to: 0-1 drink a day. Know how much alcohol is in your drink. In the [place] one drink equals one 12 oz bottle of beer (355 mL), one 5 oz glass of wine (148 mL), or one 1 oz glass of hard liquor (44 mL). Lifestyle Do not use any products that contain nicotine or tobacco. These products include cigarettes, chewing tobacco, and vaping devices, such as e-cigarettes. If you need help quitting, ask your health care provider. Do not use street drugs. Do not share needles. Ask your health care provider for help if you need support or information about quitting drugs. General instructions Schedule regular health, dental, and eye exams. Stay current with your vaccines. Tell your health care provider if: You often feel depressed. You have ever been abused or do not feel safe at home. Summary Adopting a healthy lifestyle and getting preventive care are important in promoting health  and wellness. Follow your health care provider's instructions about healthy diet, exercising, and getting tested or screened for diseases. Follow your health care provider's instructions on monitoring your cholesterol and blood pressure. This information is not intended to replace advice given to you by your health care provider. Make sure you discuss any questions you have with your health care provider. Document Revised: 02/24/2021 Document Reviewed: 02/24/2021 Elsevier Patient Education  2024 [employer].

## 2023-06-24 NOTE — Progress Notes (Signed)
Subjective:    Patient ID: Emily Nguyen, female    DOB: 1971-06-08, 52 y.o.   MRN: [id number]      HPI Emily Nguyen is here for a Physical exam and her chronic medical problems.   Here to establish with a new PCP.  Would like to get some blood work done since she has not had that in a while-doing well.   Medications and allergies reviewed with patient and updated if appropriate.  Current Outpatient Medications on File Prior to Visit  Medication Sig Dispense Refill   ibuprofen (ADVIL,MOTRIN) 200 MG tablet Take 600 mg by mouth daily as needed for headache or moderate pain.     No current facility-administered medications on file prior to visit.    Review of Systems  Constitutional:  Negative for fever.  Eyes:  Negative for visual disturbance.  Respiratory:  Negative for cough, shortness of breath and wheezing.   Cardiovascular:  Negative for chest pain, palpitations and leg swelling.  Gastrointestinal:  Negative for abdominal pain, blood in stool, constipation and diarrhea.       No gerd  Genitourinary:  Negative for dysuria.  Musculoskeletal:  Positive for arthralgias (mininal). Negative for back pain.  Skin:  Negative for rash.  Neurological:  Negative for light-headedness and headaches.  Psychiatric/Behavioral:  Negative for dysphoric mood. The patient is not nervous/anxious.        Objective:   Vitals:   06/25/23 1301  BP: 122/80  Pulse: 77  Temp: 98.5 F (36.9 C)  SpO2: 97%   Filed Weights   06/25/23 1301  Weight: 153 lb (69.4 kg)   Body mass index is 24.69 kg/m.  BP Readings from Last 3 Encounters:  06/25/23 122/80  12/18/22 111/74  11/20/21 100/60    Wt Readings from Last 3 Encounters:  06/25/23 153 lb (69.4 kg)  11/20/21 144 lb (65.3 kg)  11/06/21 144 lb (65.3 kg)       Physical Exam Constitutional: She appears well-developed and well-nourished. No distress.  HENT:  Head: Normocephalic and atraumatic.  Right Ear: External ear normal.  Normal ear canal and TM Left Ear: External ear normal.  Normal ear canal and TM Mouth/Throat: Oropharynx is clear and moist.  Eyes: Conjunctivae normal.  Neck: Neck supple. No tracheal deviation present. No thyromegaly present.  No carotid bruit  Cardiovascular: Normal rate, regular rhythm and normal heart sounds.   No murmur heard.  No edema. Pulmonary/Chest: Effort normal and breath sounds normal. No respiratory distress. She has no wheezes. She has no rales.  Breast: deferred   Abdominal: Soft. She exhibits no distension. There is no tenderness.  Lymphadenopathy: She has no cervical adenopathy.  Skin: Skin is warm and dry. She is not diaphoretic.  Psychiatric: She has a normal mood and affect. Her behavior is normal.     Lab Results  Component Value Date   WBC 4.1 12/17/2017   HGB 12.5 12/17/2017   HCT 36.3 12/17/2017   PLT 232 12/17/2017         Assessment & Plan:   Physical exam: Screening blood work  ordered Exercise  regular Weight  normal Substance abuse  none  Advised starting multivitamin or at least calcium and vitamin D  Reviewed recommended immunizations.  Tetanus today   Health Maintenance  Topic Date Due   HIV Screening  Never done   Hepatitis C Screening  Never done   PAP SMEAR-Modifier  Never done   MAMMOGRAM  08/17/2021   INFLUENZA VACCINE  05/20/2023   COVID-19 Vaccine (1 - 2023-24 season) Never done   Colonoscopy  11/20/2026   DTaP/Tdap/Td (2 - Td or Tdap) 06/24/2033   HPV VACCINES  Aged Out   Zoster Vaccines- Shingrix  Discontinued          See Problem List for Assessment and Plan of chronic medical problems.

## 2023-06-25 ENCOUNTER — Ambulatory Visit (INDEPENDENT_AMBULATORY_CARE_PROVIDER_SITE_OTHER): Payer: 59 | Admitting: Internal Medicine

## 2023-06-25 ENCOUNTER — Encounter: Payer: Self-pay | Admitting: Internal Medicine

## 2023-06-25 VITALS — BP 122/80 | HR 77 | Temp 98.5°F | Ht 66.0 in | Wt 153.0 lb

## 2023-06-25 DIAGNOSIS — Z23 Encounter for immunization: Secondary | ICD-10-CM | POA: Diagnosis not present

## 2023-06-25 DIAGNOSIS — Z0000 Encounter for general adult medical examination without abnormal findings: Secondary | ICD-10-CM | POA: Diagnosis not present

## 2023-06-28 ENCOUNTER — Other Ambulatory Visit (INDEPENDENT_AMBULATORY_CARE_PROVIDER_SITE_OTHER): Payer: 59

## 2023-06-28 DIAGNOSIS — R7989 Other specified abnormal findings of blood chemistry: Secondary | ICD-10-CM

## 2023-06-28 DIAGNOSIS — Z0000 Encounter for general adult medical examination without abnormal findings: Secondary | ICD-10-CM

## 2023-06-28 LAB — LIPID PANEL
Cholesterol: 196 mg/dL (ref 0–200)
HDL: 88.1 mg/dL (ref >39.00)
LDL Cholesterol: 92 mg/dL (ref 0–99)
NonHDL: 108.18
Total CHOL/HDL Ratio: 2
Triglycerides: 83 mg/dL (ref 0.0–149.0)
VLDL: 16.6 mg/dL (ref 0.0–40.0)

## 2023-06-28 LAB — CBC WITH DIFFERENTIAL/PLATELET
Basophils Absolute: 0.1 10*3/uL (ref 0.0–0.1)
Basophils Relative: 1 % (ref 0.0–3.0)
Eosinophils Absolute: 0.2 10*3/uL (ref 0.0–0.7)
Eosinophils Relative: 3.5 % (ref 0.0–5.0)
HCT: 37.8 % (ref 36.0–46.0)
Hemoglobin: 12.8 g/dL (ref 12.0–15.0)
Lymphocytes Relative: 36 % (ref 12.0–46.0)
Lymphs Abs: 1.8 10*3/uL (ref 0.7–4.0)
MCHC: 33.8 g/dL (ref 30.0–36.0)
MCV: 91.8 fl (ref 78.0–100.0)
Monocytes Absolute: 0.5 10*3/uL (ref 0.1–1.0)
Monocytes Relative: 10.5 % (ref 3.0–12.0)
Neutro Abs: 2.5 10*3/uL (ref 1.4–7.7)
Neutrophils Relative %: 49 % (ref 43.0–77.0)
Platelets: 265 10*3/uL (ref 150.0–400.0)
RBC: 4.12 Mil/uL (ref 3.87–5.11)
RDW: 12.5 % (ref 11.5–15.5)
WBC: 5 10*3/uL (ref 4.0–10.5)

## 2023-06-28 LAB — COMPREHENSIVE METABOLIC PANEL
ALT: 15 U/L (ref 0–35)
AST: 21 U/L (ref 0–37)
Albumin: 4.3 g/dL (ref 3.5–5.2)
Alkaline Phosphatase: 44 U/L (ref 39–117)
BUN: 13 mg/dL (ref 6–23)
CO2: 30 meq/L (ref 19–32)
Calcium: 9.8 mg/dL (ref 8.4–10.5)
Chloride: 102 meq/L (ref 96–112)
Creatinine, Ser: 0.76 mg/dL (ref 0.40–1.20)
GFR: 90.45 mL/min (ref >60.00)
Glucose, Bld: 82 mg/dL (ref 70–99)
Potassium: 3.9 meq/L (ref 3.5–5.1)
Sodium: 139 meq/L (ref 135–145)
Total Bilirubin: 0.5 mg/dL (ref 0.2–1.2)
Total Protein: 7.3 g/dL (ref 6.0–8.3)

## 2023-06-28 LAB — TSH: TSH: 5.94 u[IU]/mL — ABNORMAL HIGH (ref 0.35–5.50)
